# Patient Record
Sex: Male | Born: 1970 | Race: White | Hispanic: No | Marital: Married
Health system: Southern US, Community
[De-identification: ages and names within clinical notes are randomized; demographics above are authoritative.]

## PROBLEM LIST (undated history)

## (undated) DIAGNOSIS — N200 Calculus of kidney: Secondary | ICD-10-CM

## (undated) HISTORY — PX: HERNIA REPAIR: SHX51

## (undated) HISTORY — DX: Calculus of kidney: N20.0

---

## 2006-03-17 ENCOUNTER — Inpatient Hospital Stay: Payer: Self-pay | Admitting: Urology

## 2006-03-26 ENCOUNTER — Ambulatory Visit: Payer: Self-pay | Admitting: Urology

## 2006-04-08 ENCOUNTER — Emergency Department: Payer: Self-pay | Admitting: Emergency Medicine

## 2006-04-09 ENCOUNTER — Ambulatory Visit: Payer: Self-pay | Admitting: Urology

## 2008-03-20 IMAGING — CT CT ABD-PELV W/O CM
1 of 3 series · 15 of 32 positions shown, 20 images · non-contrast
Comparison: none

REASON FOR EXAM: (1)  Renal colic rme 1; (2) Renal colic
COMMENTS:

[Series 2: stone · axial · 0.72mm/px · z∈[-501,-81]mm · 15 of 155 slices shown, 20 images]
[im 8/155  soft-tissue]
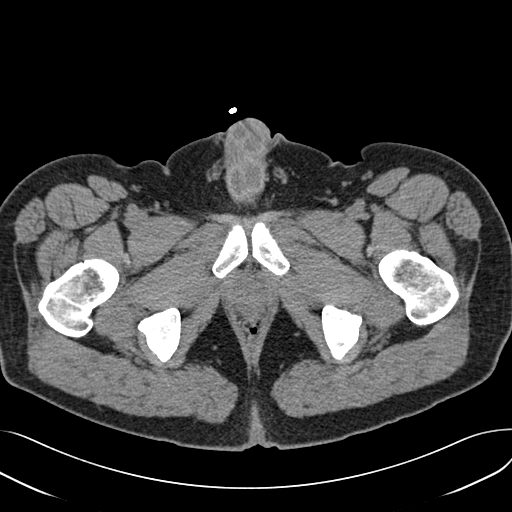
[im 8/155  bone]
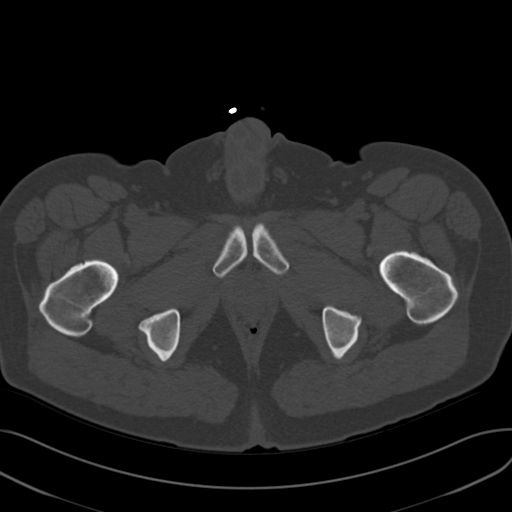
[im 22/155  soft-tissue]
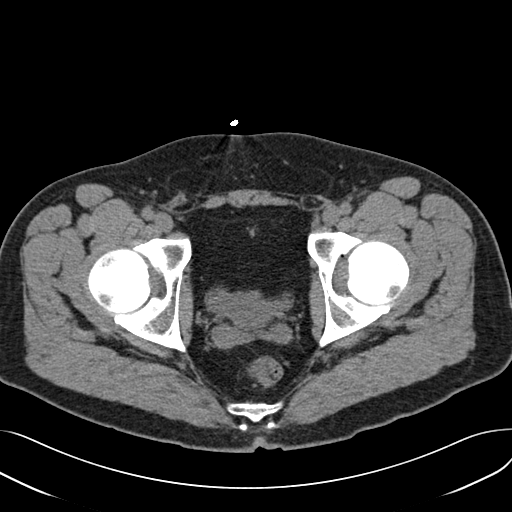
[im 29/155  soft-tissue]
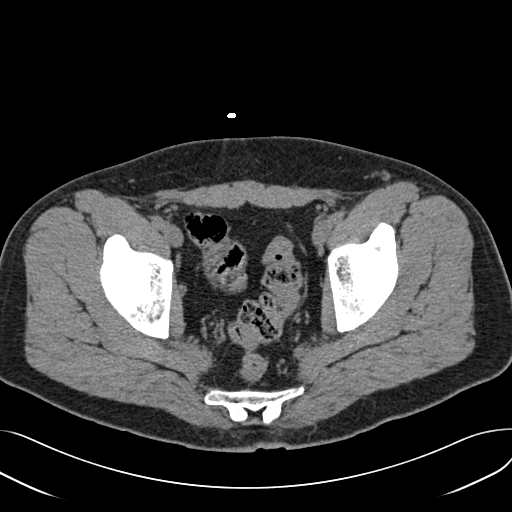
[im 43/155  soft-tissue]
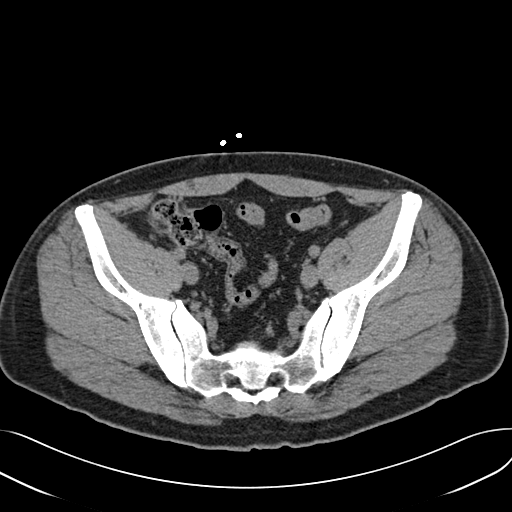
[im 50/155  soft-tissue]
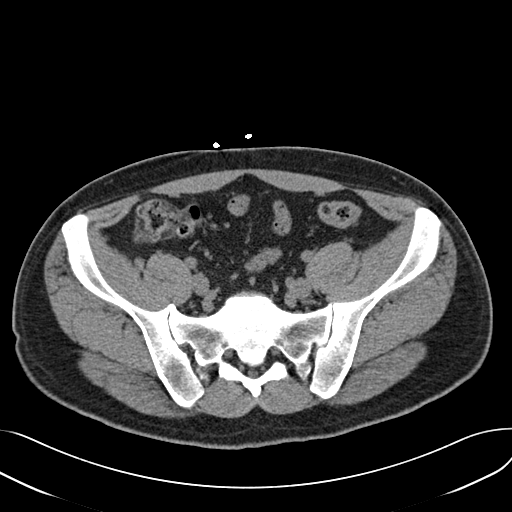
[im 64/155  soft-tissue]
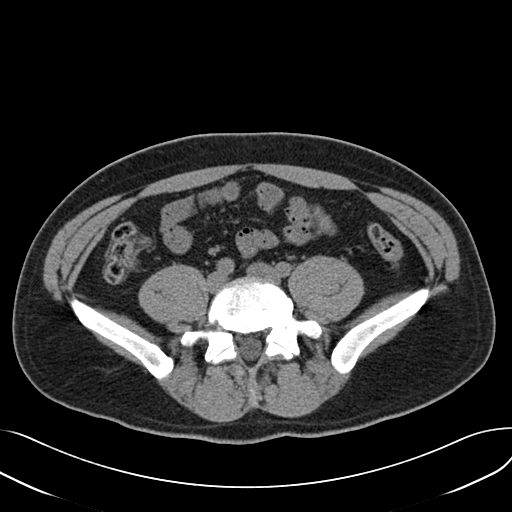
[im 71/155  soft-tissue]
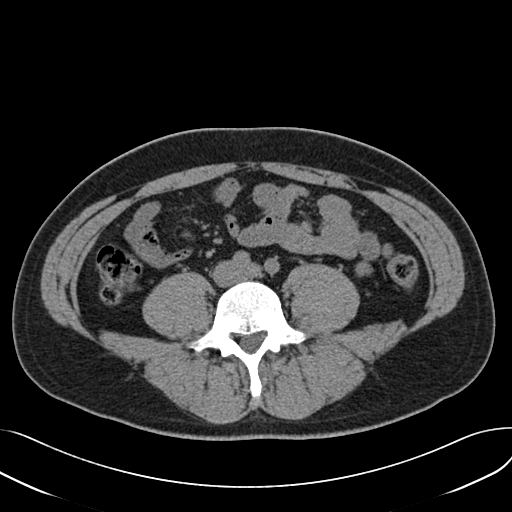
[im 85/155  soft-tissue]
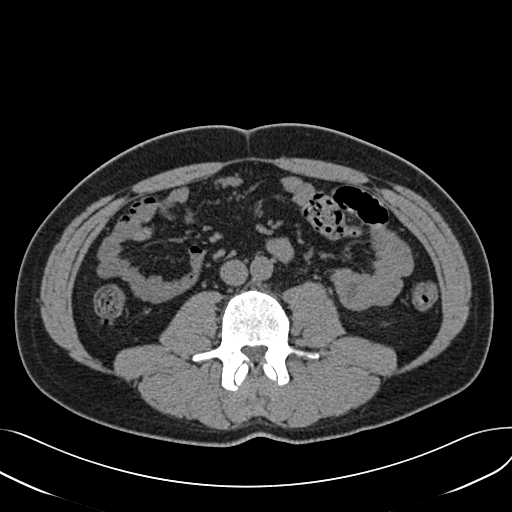
[im 92/155  soft-tissue]
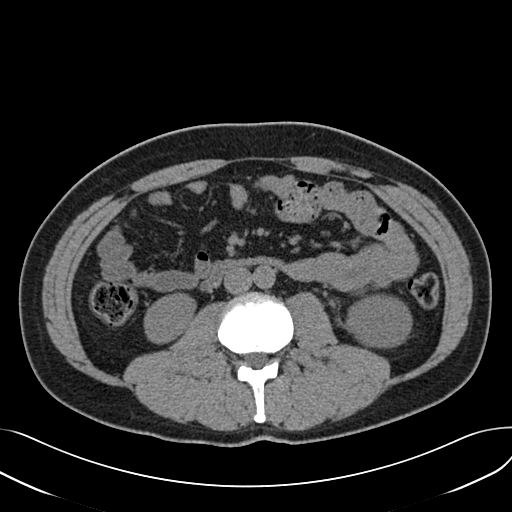
[im 92/155  bone]
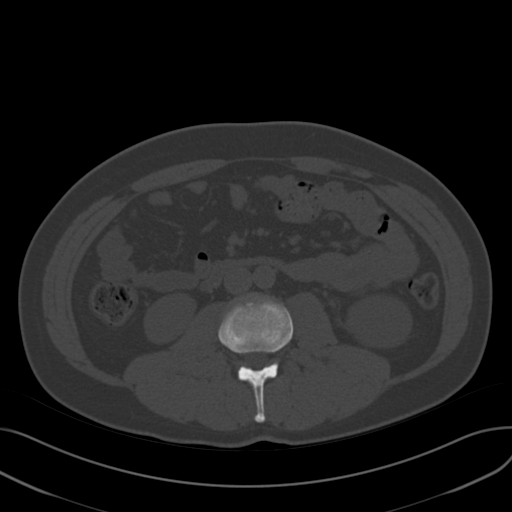
[im 106/155  soft-tissue]
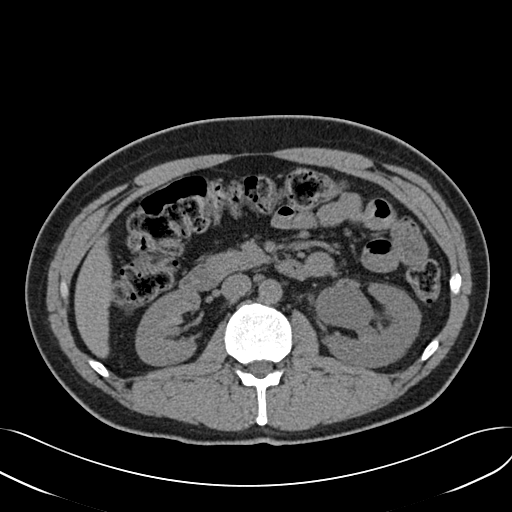
[im 113/155  soft-tissue]
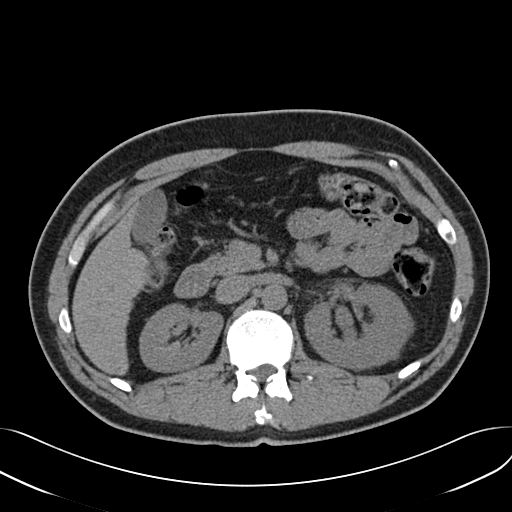
[im 127/155  soft-tissue]
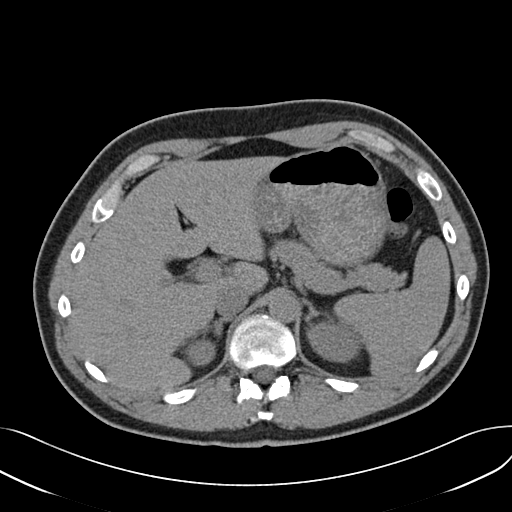
[im 127/155  lung]
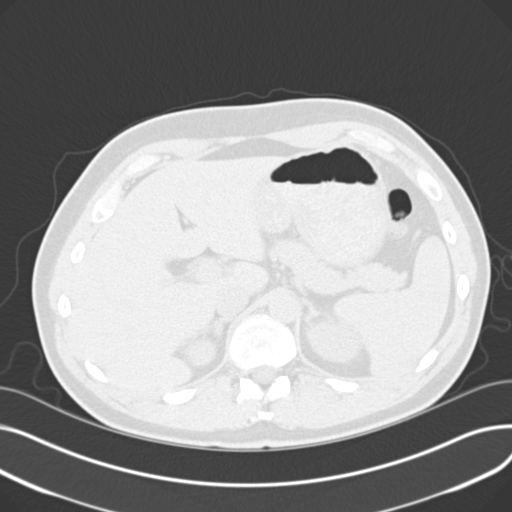
[im 134/155  soft-tissue]
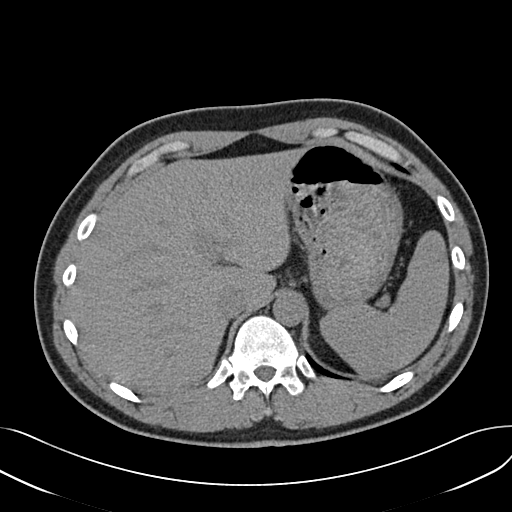
[im 134/155  lung]
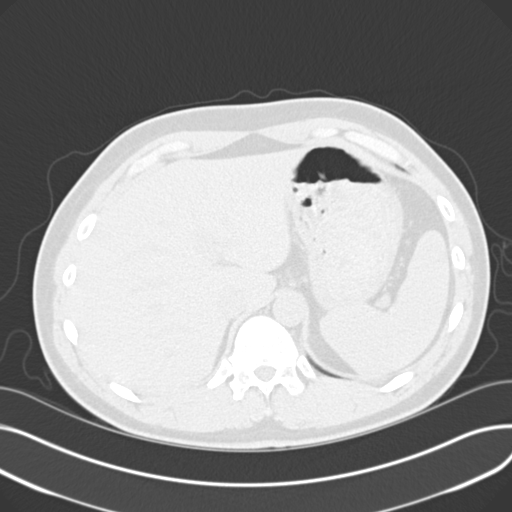
[im 141/155  lung]
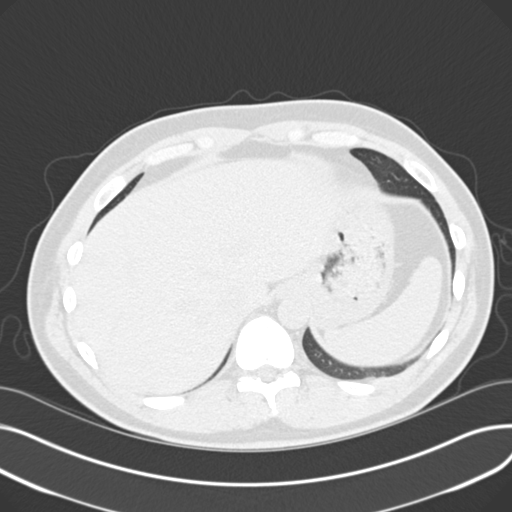
[im 148/155  soft-tissue]
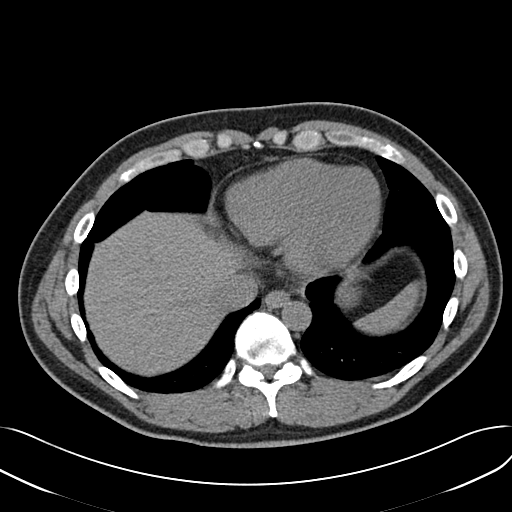
[im 148/155  lung]
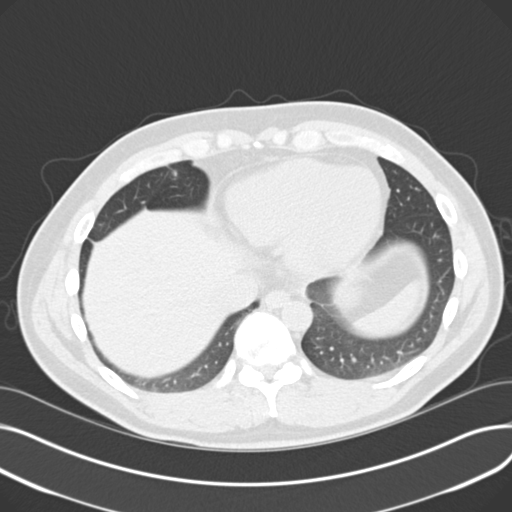

[15 of 32 positions shown; findings below may reference images not displayed]

PROCEDURE:     CT  - CT ABDOMEN AND PELVIS W[DATE]  [DATE]

RESULT:

Emergent scan was performed for LEFT flank pain.  There is a noted a 6.7 x
5.9 calculus in the proximal LEFT ureter just beyond the ureteropelvic
junction with distention of the upper collecting system.  There is some
stranding about the LEFT kidney. No intrarenal calculi are seen.

In the images through the lung bases reveal the lung bases to be clear.
IMPRESSION: Findings which are compatible with a calculus in the proximal LEFT ureter
just beyond the ureteropelvic junction with moderate distention of the upper
collecting system and perirenal stranding.

Report was called to the Emergency Room at the conclusion of dictation.

## 2010-12-12 ENCOUNTER — Emergency Department: Payer: Self-pay | Admitting: Internal Medicine

## 2013-02-21 ENCOUNTER — Ambulatory Visit: Payer: Self-pay

## 2013-02-21 LAB — URINALYSIS, COMPLETE
Bacteria: NEGATIVE
Bilirubin,UR: NEGATIVE
Nitrite: NEGATIVE
Ph: 6 (ref 4.5–8.0)
Specific Gravity: 1.01 (ref 1.003–1.030)
Squamous Epithelial: NONE SEEN

## 2013-02-23 LAB — URINE CULTURE

## 2013-03-24 ENCOUNTER — Ambulatory Visit: Payer: Self-pay | Admitting: Urology

## 2013-04-01 ENCOUNTER — Ambulatory Visit: Payer: Self-pay | Admitting: Surgery

## 2013-04-01 LAB — BASIC METABOLIC PANEL
BUN: 18 mg/dL (ref 7–18)
Chloride: 106 mmol/L (ref 98–107)
Co2: 32 mmol/L (ref 21–32)
EGFR (African American): >60
EGFR (Non-African Amer.): >60
Glucose: 79 mg/dL (ref 65–99)
Osmolality: 278 (ref 275–301)
Sodium: 139 mmol/L (ref 136–145)

## 2013-04-01 LAB — CBC WITH DIFFERENTIAL/PLATELET
Basophil #: 0 10*3/uL (ref 0.0–0.1)
Basophil %: 0.6 %
Eosinophil #: 0.1 10*3/uL (ref 0.0–0.7)
HGB: 14.3 g/dL (ref 13.0–18.0)
Lymphocyte #: 2.4 10*3/uL (ref 1.0–3.6)
MCH: 29.7 pg (ref 26.0–34.0)
MCHC: 34.7 g/dL (ref 32.0–36.0)
Monocyte #: 0.5 x10 3/mm (ref 0.2–1.0)
RBC: 4.82 10*6/uL (ref 4.40–5.90)
WBC: 7.8 10*3/uL (ref 3.8–10.6)

## 2013-04-05 ENCOUNTER — Ambulatory Visit: Payer: Self-pay | Admitting: Surgery

## 2013-04-06 ENCOUNTER — Observation Stay: Payer: Self-pay | Admitting: Surgery

## 2013-04-06 LAB — COMPREHENSIVE METABOLIC PANEL
Albumin: 3.5 g/dL (ref 3.4–5.0)
Anion Gap: 5 — ABNORMAL LOW (ref 7–16)
BUN: 9 mg/dL (ref 7–18)
Chloride: 102 mmol/L (ref 98–107)
Co2: 28 mmol/L (ref 21–32)
EGFR (Non-African Amer.): >60
Glucose: 78 mg/dL (ref 65–99)
Osmolality: 268 (ref 275–301)
Potassium: 3.5 mmol/L (ref 3.5–5.1)
SGOT(AST): 21 U/L (ref 15–37)
SGPT (ALT): 12 U/L (ref 12–78)
Sodium: 135 mmol/L — ABNORMAL LOW (ref 136–145)
Total Protein: 7.1 g/dL (ref 6.4–8.2)

## 2013-04-06 LAB — URINALYSIS, COMPLETE
Glucose,UR: NEGATIVE mg/dL (ref 0–75)
Leukocyte Esterase: NEGATIVE
Nitrite: NEGATIVE
Ph: 6 (ref 4.5–8.0)
Protein: 100
RBC,UR: 1 /HPF (ref 0–5)
Specific Gravity: 1.006 (ref 1.003–1.030)
Squamous Epithelial: NONE SEEN
WBC UR: 6 /HPF (ref 0–5)

## 2013-04-06 LAB — CBC
HCT: 42.3 % (ref 40.0–52.0)
HGB: 14.8 g/dL (ref 13.0–18.0)
RBC: 4.88 10*6/uL (ref 4.40–5.90)
RDW: 13.5 % (ref 11.5–14.5)
WBC: 9.4 10*3/uL (ref 3.8–10.6)

## 2014-11-03 NOTE — Discharge Summary (Signed)
PATIENT NAME:  [name], Jerry Nash DATE OF BIRTH:  September 08, 1970  DATE OF ADMISSION:  04/06/2013 DATE OF DISCHARGE:  04/07/2013  BRIEF HISTORY:  Jerry NipBrian Baria is a 44 year old gentleman, who underwent a preperitoneal laparoscopic right inguinal hernia repair on 04/05/2013. The repair was of a recurrent hernia and was uncomplicated. He developed significant groin pain and tenderness and presented back to the Emergency Room for further evaluation. Laboratory values were largely unremarkable. Ultrasound revealed no evidence of any testicular compromise or torsion. Because of his discomfort, we admitted him to the hospital. We were concerned that he may have a component of prostatitis or urinary tract infection from his prostate. He has been recently placed on Flomax, as he has been getting up multiple times at night to void. We empirically placed him on prophylactic ciprofloxacin 500 mg q.12 hours p.o. Symptoms improved over the course of the day. He feels better. There are no signs of any scrotal infection or hematoma. He was discharged home this evening to be followed in the office in 7 to 10 days' time.    DISCHARGE MEDICATIONS:  Include Flomax 0.4 mg p.o. daily, Toradol 10 mg every 6 hours for 4 days, Percocet 5/325 every 6 hours p.r.n., and ciprofloxacin 500 mg p.o. q.12 hours for 5 days.   FINAL DISCHARGE DIAGNOSIS:  Inguinodynia.    ____________________________ Carmie Endalph L. Ely III, MD rle:ms D: 04/07/2013 19:54:55 ET T: 04/07/2013 23:06:33 ET JOB#: [id number]  cc: Carmie Endalph L. Ely III, MD, <Dictator> Mark A. Egbert GaribaldiBird, MD Verna CzechScott C. Lonna CobbStoioff, MD Quentin OreALPH L ELY MD ELECTRONICALLY SIGNED 04/13/2013 17:11

## 2014-11-03 NOTE — H&P (Signed)
PATIENT NAME:  Jerry Nash, Jerry Nash MR#:  [id number] DATE OF BIRTH:  04-Apr-1971  DATE OF ADMISSION:  04/06/2013  PRIMARY CARE PHYSICIAN: None.   ADMITTING PHYSICIAN: Dr. Michela Pitcher.   CHIEF COMPLAINT: Abdominal pain postsurgery.   BRIEF HISTORY: Jerry Nash is a 44 year old gentleman who underwent a laparoscopic preperitoneal recurrent inguinal hernia repair on the right side yesterday. The procedure was uneventful reviewing the operative note. There did not appear to be any significant postoperative problems. He had significant pain over the course of the evening which has progressed throughout today to the point that he was markedly uncomfortable. He called the office and was referred to the Emergency Room where he was evaluated. Initially, there was some concern by the ED staff about torsion of the right testicle, so ultrasound was performed which did not demonstrate any evidence of torsion. Urinalysis was unremarkable. Blood work revealed no significant elevation of white blood cell count and no drop in his hemoglobin. Sodium was slightly depressed at 135. The surgical service was consulted.   He has recently been placed on Flomax for problems with prostatic hypertrophy. He has been having problems with urinary urgency, frequency, dysuria and nocturia. He relates his symptoms have worsened since surgery. The pain is primarily right groin, suprapubic area extending down into the right testicle.   He had a previous right inguinal hernia repair performed in 1989 in an open fashion. No mesh was placed at that time. His health is otherwise largely unremarkable. He does have a history of nephrolithiasis. He has had no other previous surgery. He has no cardiac disease, hypertension, diabetes or thyroid problems.   CURRENT MEDICATIONS: Include Percocet and Flomax.   ALLERGIES: He is not allergic to any medications.   REVIEW OF SYSTEMS: Otherwise unremarkable.   PHYSICAL EXAMINATION:  GENERAL: He is an alert,  relatively pleasant gentleman in moderate distress from pain.  VITAL SIGNS: Unremarkable. He is afebrile. Blood pressure is 124/72. Heart rate is 72 and regular. Temperature is 98.2.  HEENT: Unremarkable. He has no scleral icterus. No pupillary abnormalities.  NECK: Supple, nontender with no adenopathy.  CHEST: Clear with no adventitious sounds and normal pulmonary excursion.  CARDIAC: No murmurs or gallops to my ear and seems to be in normal sinus rhythm.  ABDOMEN: Soft, nontender, with no rebound, no guarding. He does have some mild tenderness in the right inguinal area with no bruising.  GENITOURINARY: His testicles look good with only a slight amount of bruising on the right side but certainly no swelling. No significant tenderness in the testicle.  EXTREMITIES: Lower extremity exam reveals full range of motion, no deformities.  PSYCHIATRIC: Normal orientation, normal affect, although he is in significant pain.   LABORATORY DATA: Urinalysis revealed a little bit of blood but otherwise no significant abnormalities.   ASSESSMENT AND PLAN: I do not have an etiology for this problem. With his previous prostate history, addition of Flomax to his medical regimen within the last several months and the fact that he was catheterized during his procedure, could set up a problem with urinary retention, possible prostatic inflammation. We will start him on some Cipro, give him 1 dose of IV Invanz in case he has a resistant organism and get a bladder scan. We will treat him with pain medicine and antiemetics and see how he does over the next 24 to 48 hours. This plan has been discussed with the patient in detail. His wife was present for the interview, and they are in agreement.  ____________________________ Carmie Endalph L. Ely III, MD rle:gb D: 04/06/2013 21:35:26 ET T: 04/06/2013 21:52:42 ET JOB#: [id number]  cc: Quentin Orealph L. Ely III, MD, <Dictator> Scott C. Lonna CobbStoioff, MD Quentin OreALPH L ELY MD ELECTRONICALLY SIGNED  04/07/2013 6:23

## 2014-11-03 NOTE — Op Note (Signed)
PATIENT NAME:  Jerry Nash, Conor T MR#:  [id number] DATE OF BIRTH:  Dec 10, 1970  DATE OF PROCEDURE:  04/05/2013  PREOPERATIVE DIAGNOSIS: Symptomatic right inguinal hernia, recurrent.   POSTOPERATIVE DIAGNOSIS: Symptomatic right inguinal hernia, recurrent.  PROCEDURE PERFORMED:  Laparoscopic preperitoneal herniorrhaphy with mesh placement.   SURGEON: Nickayla Mcinnis A. Egbert GaribaldiBird, MD   SURGICAL PROCTOR:  Dionne Miloichard Cooper, MD   TYPE OF ANESTHESIA: General oral endotracheal and local.   FINDINGS: Small indirect inguinal hernia, large direct inguinal hernia.   SPECIMENS: None.   ESTIMATED BLOOD LOSS: Minimal.   DESCRIPTION OF PROCEDURE: With informed consent, supine position, general oral endotracheal anesthesia, Foley catheter was placed under sterile technique without difficulty. The patient's abdomen was clipped of hair, prepped and draped with ChloraPrep solution. Timeout was observed. Both arms were padded and tucked at his side. Timeout was observed.   An obliquely-oriented right-sided abdominal incision was fashioned below the umbilicus with scalpel and carried with sharp dissection to the abdominal rectus fascia. Rectus fascia was incised the length of the incision with the scalpel measuring 2 cm. The rectus muscle was identified and swept laterally. The preperitoneal space was then dissected with a round Origin balloon. This was done under direct visualization.  The dissecting balloon was then exchanged for the operating structural balloon. CO2 was insufflated in the preperitoneal space.   Dissection was then undertaken by identification of Cooper's ligament. The space lateral to the cord on the anterior abdominal wall was identified and swept of peritoneal reflection. The spermatic cord was then encircled with blunt technique. The peritoneal reflection of an indirect inguinal hernia sac was identified and swept towards the kidney.   There was a large direct inguinal hernia which demonstrated redundant hernia  sac which was then returned back into the preperitoneal space and tacked to Cooper's ligament.   A 4 x 6 cm Atrium ProLite mesh was brought onto the field, scissored for the egress of the spermatic cord, inserted into the preperitoneal space and then secured along Cooper's ligament and the anterior abdominal wall with the 2 leaves being crisscrossed to re-create the internal ring. ProTacks were then placed in the area of the nerve. Mesh was placed mesh-to-mesh both at the anterior and posteriorly around the spermatic cord structures. One ProTack was placed on either side of the epigastric vessels. Several more ProTacks were then placed along Cooper's ligament.   Pneumoperitoneum was then deflated under direct visualization. There was no evidence of violation of the peritoneal cavity. Trocars were then removed, the rectus fascia reapproximated with a figure-of-eight #0 Vicryl suture. A total of 30 mL of 0.25% plain Marcaine was infiltrated along all skin and fascial incisions prior to closure. A 4-0 Vicryl subcuticular was applied to all skin edges followed by the application of Steri-Strips, Telfa and Tegaderm. The Foley catheter was removed. There was a moderate amount of subcutaneous emphysema within the scrotum. The patient was then transported to the recovery room in stable and satisfactory condition by anesthesia services.     ____________________________ Redge GainerMark A. Egbert GaribaldiBird, MD mab:cs D: 04/05/2013 14:03:32 ET T: 04/05/2013 14:17:08 ET JOB#: [id number]  cc: Loraine LericheMark A. Egbert GaribaldiBird, MD, <Dictator> Raynald KempMARK A Elim Peale MD ELECTRONICALLY SIGNED 04/07/2013 17:13

## 2023-05-26 ENCOUNTER — Other Ambulatory Visit: Payer: Self-pay | Admitting: Physician Assistant

## 2023-05-26 DIAGNOSIS — M5116 Intervertebral disc disorders with radiculopathy, lumbar region: Secondary | ICD-10-CM

## 2023-05-28 ENCOUNTER — Ambulatory Visit
Admission: RE | Admit: 2023-05-28 | Discharge: 2023-05-28 | Disposition: A | Discharge status: Home / Self Care | Payer: 59 | Source: Ambulatory Visit | Attending: Physician Assistant | Admitting: Physician Assistant

## 2023-05-28 DIAGNOSIS — M5116 Intervertebral disc disorders with radiculopathy, lumbar region: Secondary | ICD-10-CM

## 2023-09-23 ENCOUNTER — Inpatient Hospital Stay
Admission: RE | Admit: 2023-09-23 | Discharge: 2023-09-23 | Disposition: A | Discharge status: Home / Self Care | Payer: Self-pay | Source: Ambulatory Visit | Attending: Neurosurgery | Admitting: Neurosurgery

## 2023-09-23 ENCOUNTER — Other Ambulatory Visit: Payer: Self-pay | Admitting: Family Medicine

## 2023-09-23 ENCOUNTER — Encounter: Payer: Self-pay | Admitting: Neurosurgery

## 2023-09-23 DIAGNOSIS — Z049 Encounter for examination and observation for unspecified reason: Secondary | ICD-10-CM

## 2023-09-23 NOTE — Progress Notes (Unsigned)
 Referring Physician:  Denton Lank, FNP [id number] [place],  [place] [address]  Primary Physician:  Pcp, No  History of Present Illness: 09/24/2023 Jerry Nash is here today with a chief complaint of a 20-year history of chronic back pain that has significantly worsened over the past three months. The pain is located on the left side and radiates down the back of his leg to his foot, causing numbness in his toes. The pain is exacerbated by walking and bending, and is somewhat relieved when lying flat on his stomach. The patient has undergone physical therapy and received injections for his pain, but these interventions have provided only temporary relief. The patient also reports occasional discomfort and numbness in his left forearm. He has a history of tobacco use, both smoking and dipping.  Bowel/Bladder Dysfunction: none  Conservative measures:  Physical therapy: has participated in PT at [facility] and been discharged Multimodal medical therapy including regular antiinflammatories: Gabapentin, Ibuprofen,Prednisone   Injections: 06/09/2023 Left L5-S1 epidural steroid injections 07/16/2023 Left L5-S1 epidural steroid injections  Past Surgery: none  [name] has no symptoms of cervical myelopathy.  The symptoms are causing a significant impact on the patient's life.   I have utilized the care everywhere function in epic to review the outside records available from external health systems.  Review of Systems:  A 10 point review of systems is negative, except for the pertinent positives and negatives detailed in the HPI.  Past Medical History: Past Medical History:  Diagnosis Date   Kidney stones     Past Surgical History: N/a  Allergies: Allergies as of 09/24/2023 - Review Complete 09/24/2023  Allergen Reaction Noted   Naproxen Nausea And Vomiting 03/24/2014    Medications:  Current Outpatient Medications:    cyclobenzaprine (FLEXERIL) 5 MG  tablet, Take 5 mg by mouth 3 (three) times daily as needed., Disp: , Rfl:    ibuprofen (ADVIL) 200 MG tablet, Take by mouth., Disp: , Rfl:   Social History: Social History   Tobacco Use   Smoking status: Every Day    Types: Cigarettes   Smokeless tobacco: Current    Family Medical History: No family history on file.  Physical Examination: Vitals:   09/24/23 0846  BP: (!) 148/88    General: Patient is in no apparent distress. Attention to examination is appropriate.  Neck:   Supple.  Full range of motion.  Respiratory: Patient is breathing without any difficulty.   NEUROLOGICAL:     Awake, alert, oriented to person, place, and time.  Speech is clear and fluent.   Cranial Nerves: Pupils equal round and reactive to light.  Facial tone is symmetric.  Facial sensation is symmetric. Shoulder shrug is symmetric. Tongue protrusion is midline.  There is no pronator drift.  Strength: Side Biceps Triceps Deltoid Interossei Grip Wrist Ext. Wrist Flex.  R 5 5 5 5 5 5 5   L 5 5 5 5 5 5 5    Side Iliopsoas Quads Hamstring PF DF EHL  R 5 5 5 5 5 5   L 5 5 5 5 5 5    Reflexes are 1+ and symmetric at the biceps, triceps, brachioradialis, patella and achilles.   Hoffman's is absent.   Bilateral upper and lower extremity sensation is intact to light touch with exception of the left L5 distribution, as well as the left ulnar distribution which is diminished.    No evidence of dysmetria noted.  Gait is antalgic.  Straight leg  raise is positive at 45 degrees on the left.   He has some discomfort around his left elbow.  Medical Decision Making  Imaging: MRI L spine 05/28/2023 IMPRESSION: 1. At T12-L1,right paracentral disc protrusion contacts and posteriorly displaces the conus with moderate canal stenosis. 2. At L1-L2, right subarticular/foraminal disc protrusion with resulting right subarticular recess stenosis and potential for impingement of the descending right L2 nerves. Mild  central canal stenosis and mild right foraminal stenosis. 3. At L2-L3, mild-to-moderate canal and left foraminal stenosis. Mild right foraminal stenosis. 4. At L3-L4, moderate left and mild right foraminal stenosis. Mild canal stenosis. 5. At L4-L5, moderate to severe bilateral foraminal stenosis. 6. At L5-S1, moderate to severe right and moderate left foraminal stenosis with far lateral/extraforaminal disc closely approximating the exiting right L5 nerve.     Electronically Signed   By: Feliberto Harts M.D.   On: 06/05/2023 15:18  L spine xrays 05/26/2023 2 views of the LS-spine ordered and interpreted on today's visit shows no  evidence any scoliosis. Normal lordotic curve noted. Noted to have  significant degenerative disc disease throughout the entire lumbar spine.  Worse at the L4-L5 as well as L5-S1 level with subchondral sclerosis been  noted. Noted to have significant osteophyte formation to the vertebral  bodies throughout the lumbar spine. Narrowing of the neuroforamina is  noted. There are degenerative changes to the facet region.    I have personally reviewed the images and agree with the above interpretation.  Assessment and Plan: Jerry Nash is a pleasant 53 y.o. male with chronic low back pain with left-sided sciatica.  He has substantial lumbar spondylosis.  He has possible left-sided ulnar neuropathy.  Lumbar Radiculopathy, Chronic low back pain with left-sided sciatica Chronic left-sided lumbar radiculopathy with disc bulges and nerve root compression at L5 and S1. Proposed L4-S1 TLIF to decompress nerve roots. Nicotine cessation critical for surgical success. - He has tried and failed conservative management.  No further conservative management is indicated  - advise complete cessation of nicotine for 30 days prior to surgery. - Provide smoking cessation support. - Schedule follow-up post-nicotine test results.  Arthritis of the Spine Significant arthritis  complicating surgical approach. Bone spurs and extra epidural fat at L5 and S1 necessitate extensive decompression. -L4-S1 TLIF to allow for decompression of the nerve roots will be required as iatrogenic destabilization will be necessary to fully decompress the nerve roots  Nicotine Dependence Nicotine dependence affects spinal fusion outcomes. Cessation required for 30 days pre-surgery to improve success rate.  Cubital Tunnel Syndrome Intermittent numbness in left forearm and hand suggests ulnar nerve involvement. Further evaluation needed. - Arrange nerve conduction study. - Refer to Dr. Katrinka Blazing for evaluation and treatment options.  I spent a total of 30 minutes in this patient's care today. This time was spent reviewing pertinent records including imaging studies, obtaining and confirming history, performing a directed evaluation, formulating and discussing my recommendations, and documenting the visit within the medical record.    Thank you for involving me in the care of this patient.      Amir Fick K. Myer Haff MD, [facility] Neurosurgery

## 2023-09-24 ENCOUNTER — Ambulatory Visit: Admitting: Neurosurgery

## 2023-09-24 ENCOUNTER — Encounter: Payer: Self-pay | Admitting: Neurosurgery

## 2023-09-24 VITALS — BP 148/88 | Ht 69.02 in | Wt 207.0 lb

## 2023-09-24 DIAGNOSIS — M5416 Radiculopathy, lumbar region: Secondary | ICD-10-CM

## 2023-09-24 DIAGNOSIS — G8929 Other chronic pain: Secondary | ICD-10-CM | POA: Diagnosis not present

## 2023-09-24 DIAGNOSIS — M4726 Other spondylosis with radiculopathy, lumbar region: Secondary | ICD-10-CM

## 2023-09-24 DIAGNOSIS — M5442 Lumbago with sciatica, left side: Secondary | ICD-10-CM | POA: Diagnosis not present

## 2023-09-24 DIAGNOSIS — G5622 Lesion of ulnar nerve, left upper limb: Secondary | ICD-10-CM

## 2023-09-28 ENCOUNTER — Other Ambulatory Visit: Payer: Self-pay

## 2023-09-28 ENCOUNTER — Encounter: Payer: Self-pay | Admitting: Neurology

## 2023-09-28 DIAGNOSIS — R202 Paresthesia of skin: Secondary | ICD-10-CM

## 2023-10-09 ENCOUNTER — Encounter: Admitting: Neurology
